# Patient Record
Sex: Male | Born: 1965 | Race: White | Hispanic: No | Marital: Married | Smoking: Never smoker
Health system: Southern US, Community
[De-identification: ages and names within clinical notes are randomized; demographics above are authoritative.]

## PROBLEM LIST (undated history)

## (undated) DIAGNOSIS — I10 Essential (primary) hypertension: Secondary | ICD-10-CM

## (undated) HISTORY — PX: OTHER SURGICAL HISTORY: SHX169

## (undated) HISTORY — PX: APPENDECTOMY: SHX54

## (undated) HISTORY — PX: COLONOSCOPY: SHX174

---

## 2006-04-08 ENCOUNTER — Ambulatory Visit: Payer: Self-pay | Admitting: Urology

## 2008-03-11 ENCOUNTER — Ambulatory Visit: Payer: Self-pay | Admitting: Urology

## 2010-04-02 IMAGING — CT CT ABD-PELV W/O CM
1 of 2 series · 16 of 32 positions shown, 20 images · non-contrast
Comparison: none

REASON FOR EXAM: Hematuria, left flank BPH
COMMENTS:

PROCEDURE:     CT  - CT ABDOMEN AND PELVIS W[DATE] [DATE]
RESULT:     The patient has a history of renal stones, left lithotripsy.
TECHNIQUE: Nonenhanced CT of abdomen and pelvis is obtained.
Comparison is made to prior KUB of 04/08/2006.

[Series 2: stone · axial · 0.86mm/px · z∈[-51,+465]mm · 16 of 188 slices shown, 20 images]
[im 8/188  soft-tissue]
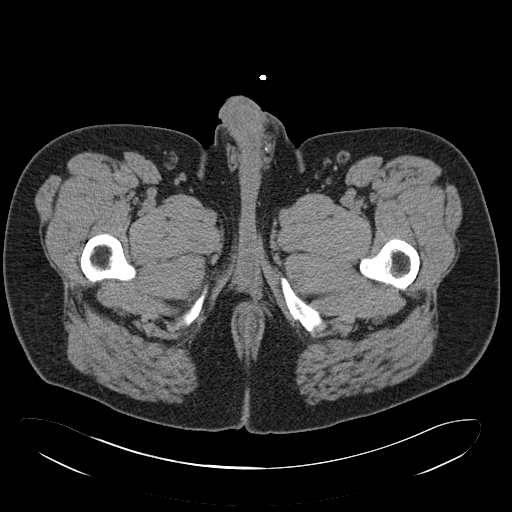
[im 8/188  bone]
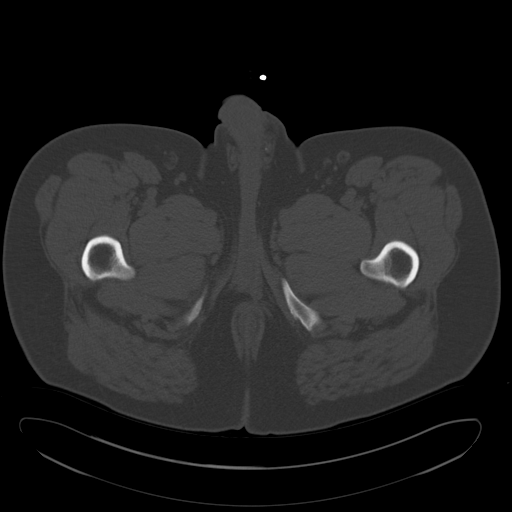
[im 22/188  soft-tissue]
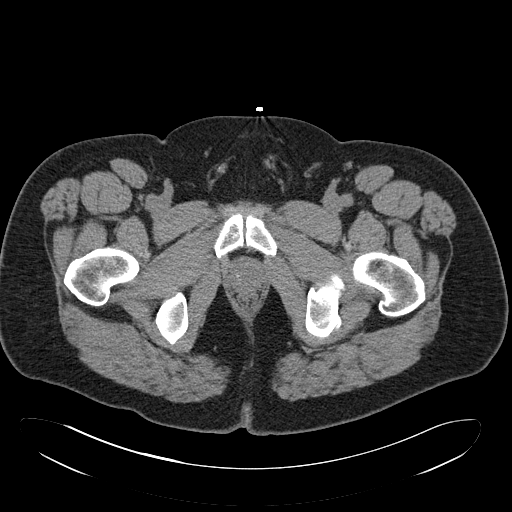
[im 36/188  soft-tissue]
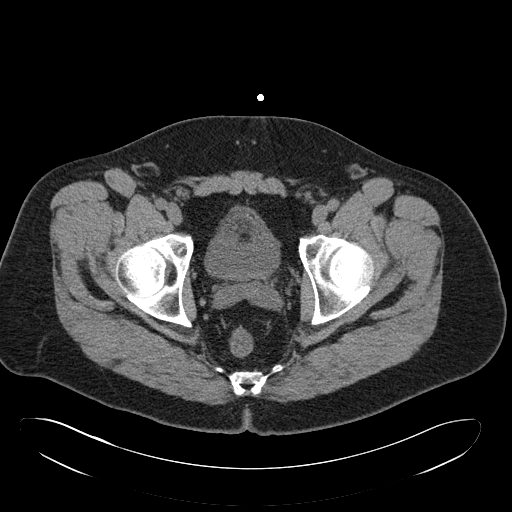
[im 51/188  soft-tissue]
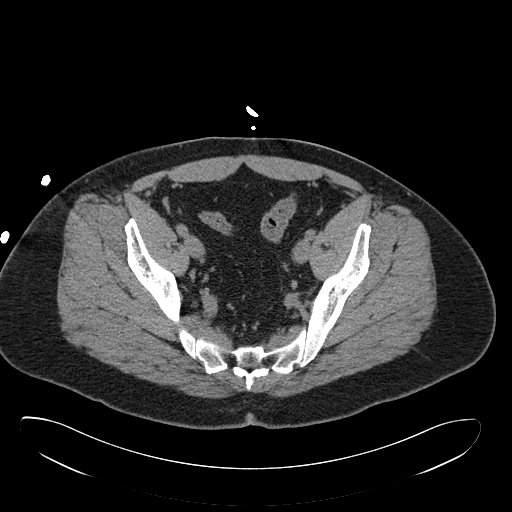
[im 65/188  soft-tissue]
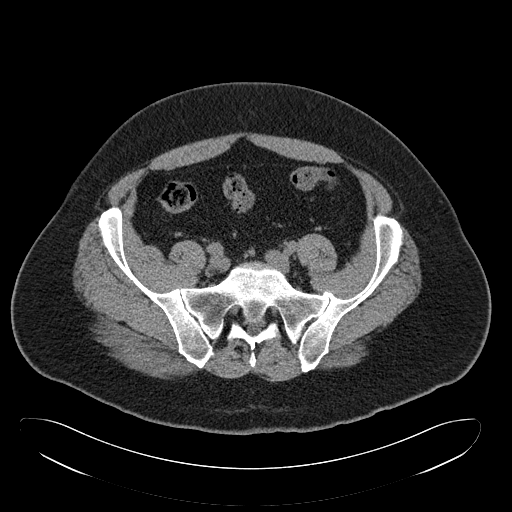
[im 72/188  soft-tissue]
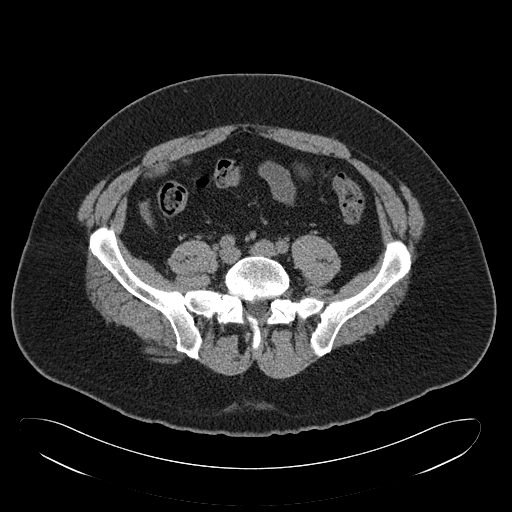
[im 87/188  soft-tissue]
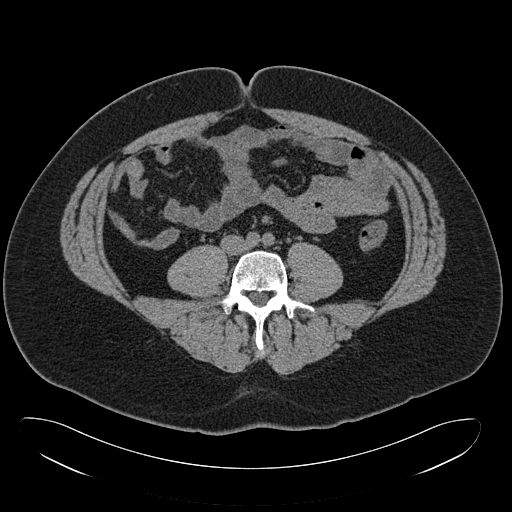
[im 101/188  soft-tissue]
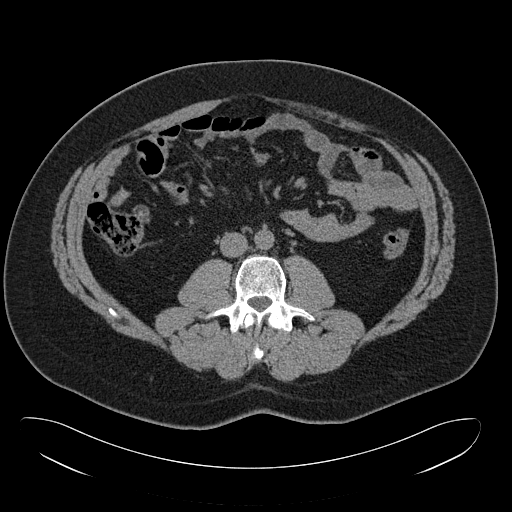
[im 116/188  soft-tissue]
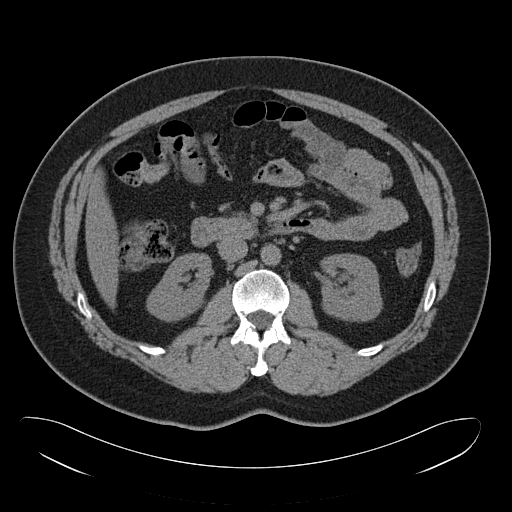
[im 116/188  bone]
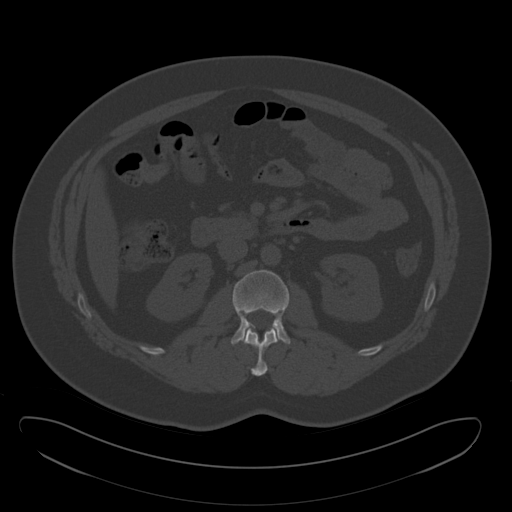
[im 123/188  soft-tissue]
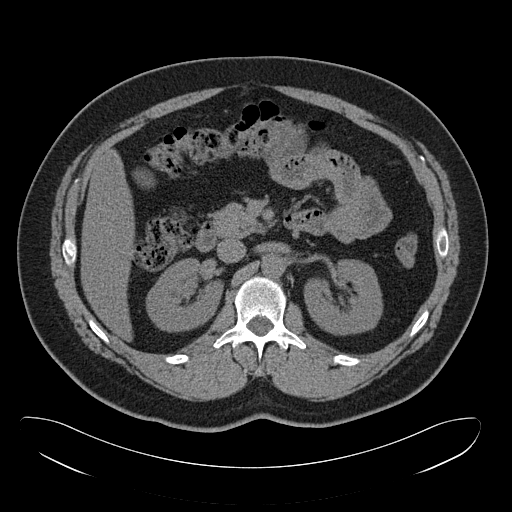
[im 137/188  soft-tissue]
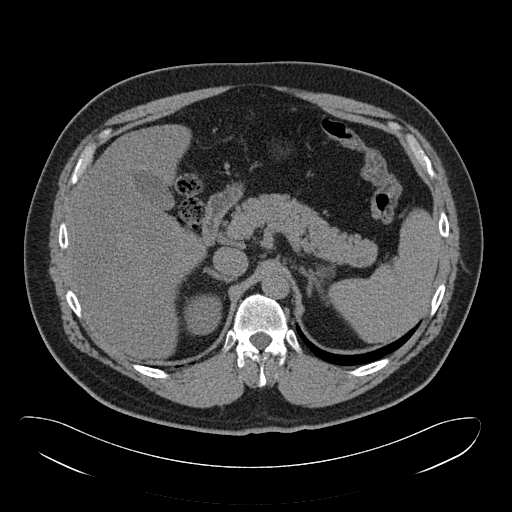
[im 152/188  soft-tissue]
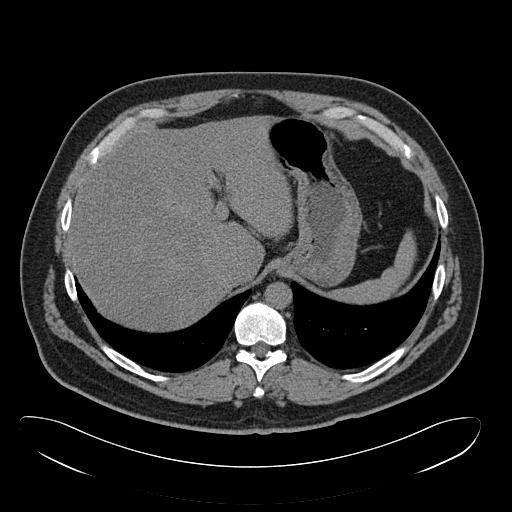
[im 159/188  lung]
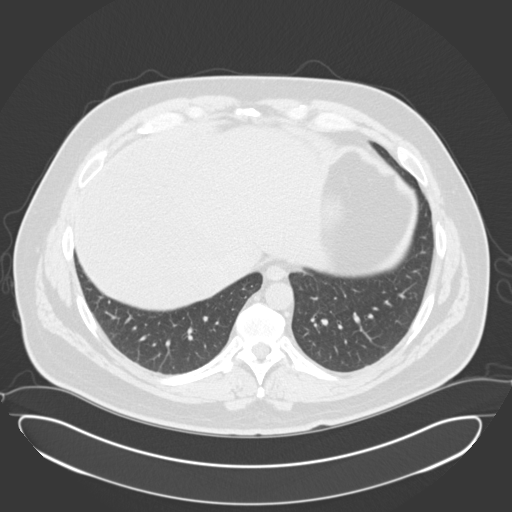
[im 166/188  soft-tissue]
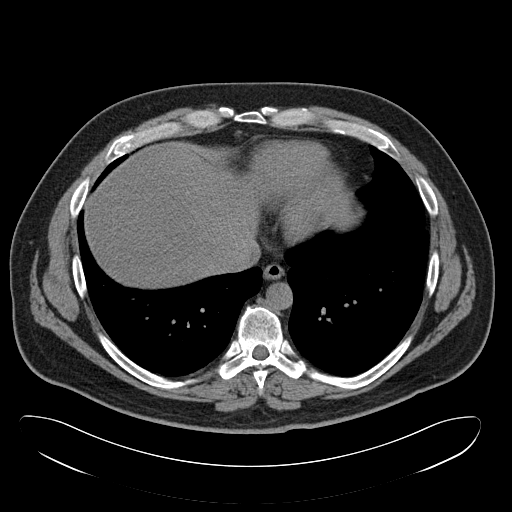
[im 166/188  lung]
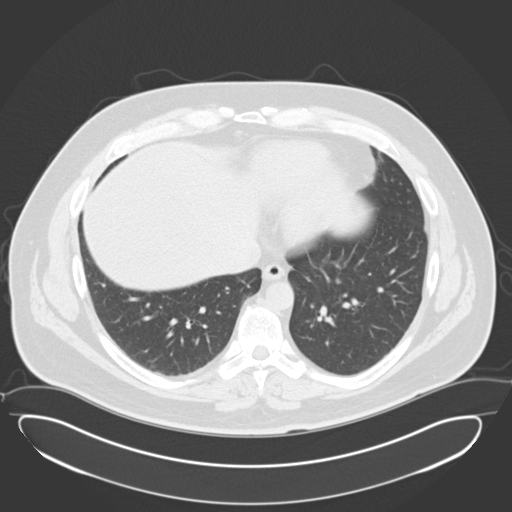
[im 173/188  lung]
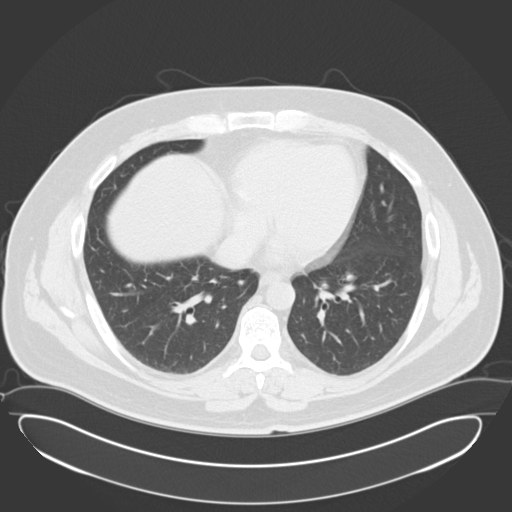
[im 180/188  soft-tissue]
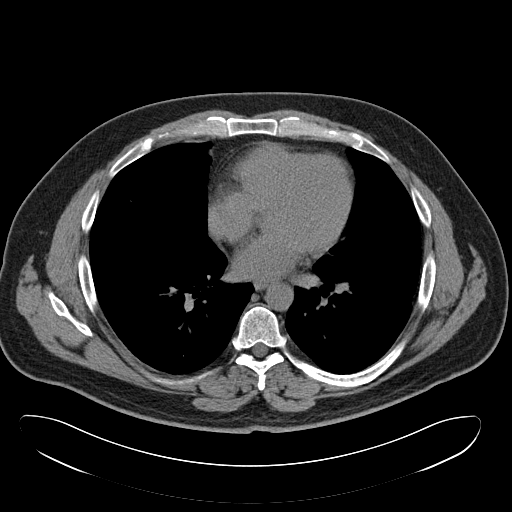
[im 180/188  lung]
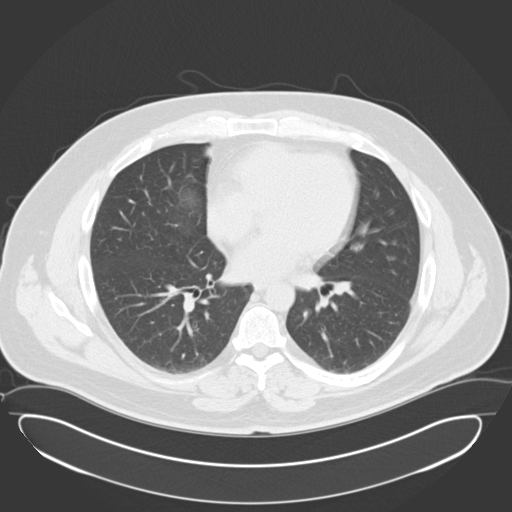

[16 of 32 positions shown; findings below may reference images not displayed]

FINDINGS: The liver and spleen are normal. The pancreas is normal. The
adrenals are normal. There is no hydronephrosis. A small, 5 mm stone is
noted in the left lower renal pole. The ureters are normal. There is no
evidence of a ureteral stone. Calcified pelvic phleboliths are present. The
bladder is unremarkable. No inguinal adenopathy is noted. Prostatic
calcification is present. A small, left adrenal lesion is noted. This is
most likely a small adrenal adenoma.
IMPRESSION: There is a 4 to 5 mm stone in the left lower renal calix.
There is no evidence of urolithiasis.

## 2019-02-24 ENCOUNTER — Other Ambulatory Visit: Payer: Self-pay

## 2019-02-24 ENCOUNTER — Ambulatory Visit: Payer: Self-pay

## 2019-02-24 DIAGNOSIS — Z021 Encounter for pre-employment examination: Secondary | ICD-10-CM

## 2019-02-24 NOTE — Progress Notes (Signed)
Presents for pre-employment drug screen. Specimen collected using [facility] Chain of Custody form for [facility] account number [id number]. Specimen # [id number]  AMD

## 2019-03-20 ENCOUNTER — Other Ambulatory Visit: Payer: Self-pay

## 2019-03-20 ENCOUNTER — Telehealth: Payer: Self-pay

## 2019-03-20 ENCOUNTER — Ambulatory Visit: Payer: Self-pay

## 2019-03-20 DIAGNOSIS — Z0110 Encounter for examination of ears and hearing without abnormal findings: Secondary | ICD-10-CM

## 2019-03-20 NOTE — Progress Notes (Signed)
Patient comes in today to complete the new hire hearing exam.

## 2019-03-20 NOTE — Telephone Encounter (Signed)
[place] scheduled for a base line hearing screen today at 8:30 am & didn't show up for appointment.  Tried to call [facility] & it went straight to voice mail. Left message for him to call the clinic to reschedule the hearing screen.  AMD

## 2019-03-26 NOTE — Addendum Note (Signed)
Addended by: Gardner Candle on: 03/26/2019 11:37 AM   Modules accepted: Orders

## 2020-07-18 ENCOUNTER — Other Ambulatory Visit: Payer: Self-pay

## 2020-07-18 DIAGNOSIS — Z0283 Encounter for blood-alcohol and blood-drug test: Secondary | ICD-10-CM

## 2020-07-18 NOTE — Progress Notes (Addendum)
Presents to [facility] for Random DOT Drug Screen & Breath Alcohol Screen.  [facility] Acct #:  [id number] [facility] Specimen #:  [id number]  AMD

## 2020-09-09 ENCOUNTER — Ambulatory Visit: Payer: Self-pay

## 2020-09-09 ENCOUNTER — Other Ambulatory Visit: Payer: Self-pay

## 2020-09-09 DIAGNOSIS — Z0110 Encounter for examination of ears and hearing without abnormal findings: Secondary | ICD-10-CM

## 2020-09-09 NOTE — Progress Notes (Signed)
Presents to [facility] for annual hearing screen.  Works for [employer] & his position requires an annual hearing screen for [facility] doctor.  AMD

## 2022-09-14 ENCOUNTER — Ambulatory Visit: Payer: Self-pay

## 2023-01-30 ENCOUNTER — Encounter: Payer: Self-pay | Admitting: *Deleted

## 2023-02-04 ENCOUNTER — Ambulatory Visit: Payer: 59

## 2023-02-04 ENCOUNTER — Encounter
Admission: RE | Disposition: A | Discharge status: Home / Self Care | Payer: Self-pay | Source: Home / Self Care | Attending: Gastroenterology

## 2023-02-04 ENCOUNTER — Encounter: Payer: Self-pay | Admitting: *Deleted

## 2023-02-04 ENCOUNTER — Ambulatory Visit
Admission: RE | Admit: 2023-02-04 | Discharge: 2023-02-04 | Disposition: A | Discharge status: Home / Self Care | Payer: 59 | Attending: Gastroenterology | Admitting: Gastroenterology

## 2023-02-04 DIAGNOSIS — E669 Obesity, unspecified: Secondary | ICD-10-CM | POA: Diagnosis not present

## 2023-02-04 DIAGNOSIS — D123 Benign neoplasm of transverse colon: Secondary | ICD-10-CM | POA: Insufficient documentation

## 2023-02-04 DIAGNOSIS — Z1211 Encounter for screening for malignant neoplasm of colon: Secondary | ICD-10-CM | POA: Diagnosis present

## 2023-02-04 DIAGNOSIS — K5730 Diverticulosis of large intestine without perforation or abscess without bleeding: Secondary | ICD-10-CM | POA: Diagnosis not present

## 2023-02-04 DIAGNOSIS — I10 Essential (primary) hypertension: Secondary | ICD-10-CM | POA: Insufficient documentation

## 2023-02-04 DIAGNOSIS — K640 First degree hemorrhoids: Secondary | ICD-10-CM | POA: Insufficient documentation

## 2023-02-04 DIAGNOSIS — Z860101 Personal history of adenomatous and serrated colon polyps: Secondary | ICD-10-CM | POA: Insufficient documentation

## 2023-02-04 DIAGNOSIS — Z6837 Body mass index (BMI) 37.0-37.9, adult: Secondary | ICD-10-CM | POA: Diagnosis not present

## 2023-02-04 DIAGNOSIS — Z09 Encounter for follow-up examination after completed treatment for conditions other than malignant neoplasm: Secondary | ICD-10-CM | POA: Insufficient documentation

## 2023-02-04 HISTORY — PX: POLYPECTOMY: SHX5525

## 2023-02-04 HISTORY — PX: COLONOSCOPY WITH PROPOFOL: SHX5780

## 2023-02-04 HISTORY — DX: Essential (primary) hypertension: I10

## 2023-02-04 SURGERY — COLONOSCOPY WITH PROPOFOL
Anesthesia: General

## 2023-02-04 MED ORDER — STERILE WATER FOR IRRIGATION IR SOLN
Status: DC | PRN
  Administered 2023-02-04: 60 mL

## 2023-02-04 MED ORDER — LIDOCAINE HCL (PF) 2 % IJ SOLN
INTRAMUSCULAR | Status: AC
  Filled 2023-02-04: qty 5

## 2023-02-04 MED ORDER — LIDOCAINE HCL (PF) 2 % IJ SOLN
INTRAMUSCULAR | Status: DC | PRN
  Administered 2023-02-04: 50 mg via INTRADERMAL

## 2023-02-04 MED ORDER — PROPOFOL 500 MG/50ML IV EMUL
INTRAVENOUS | Status: DC | PRN
  Administered 2023-02-04: 100 mg via INTRAVENOUS
  Administered 2023-02-04: 200 ug/kg/min via INTRAVENOUS

## 2023-02-04 MED ORDER — SODIUM CHLORIDE 0.9 % IV SOLN: INTRAVENOUS | Status: DC

## 2023-02-04 MED ORDER — PROPOFOL 1000 MG/100ML IV EMUL
INTRAVENOUS | Status: AC
  Filled 2023-02-04: qty 100

## 2023-02-04 NOTE — Interval H&P Note (Signed)
History and Physical Interval Note:  02/04/2023 7:46 AM  Clarence Flores  has presented today for surgery, with the diagnosis of Z86.010 (ICD-10-CM) - History of adenomatous polyp of colon.  The various methods of treatment have been discussed with the patient and family. After consideration of risks, benefits and other options for treatment, the patient has consented to  Procedure(s): COLONOSCOPY WITH PROPOFOL (N/A) as a surgical intervention.  The patient's history has been reviewed, patient examined, no change in status, stable for surgery.  I have reviewed the patient's chart and labs.  Questions were answered to the patient's satisfaction.     Regis Bill  Ok to proceed with colonoscopy

## 2023-02-04 NOTE — Op Note (Signed)
[facility] Patient Name: Clarence Flores Procedure Date: 02/04/2023 7:14 AM MRN: [id number] Account #: [id number] Date of Birth: [birth year] Admit Type: Outpatient Age: 57 Room: [facility] ENDO ROOM 3 Gender: Male Note Status: Finalized Instrument Name: Prentice Docker [id number] Procedure:             Colonoscopy Indications:           Surveillance: Personal history of adenomatous polyps                         on last colonoscopy 5 years ago Providers:             Eather Colas MD, MD Referring MD:          Eather Colas MD, MD (Referring MD), Rhona Leavens.                         Burnett Sheng, MD (Referring MD) Medicines:             Monitored Anesthesia Care Complications:         No immediate complications. Estimated blood loss:                         Minimal. Procedure:             Pre-Anesthesia Assessment:                        - Prior to the procedure, a History and Physical was                         performed, and patient medications and allergies were                         reviewed. The patient is competent. The risks and                         benefits of the procedure and the sedation options and                         risks were discussed with the patient. All questions                         were answered and informed consent was obtained.                         Patient identification and proposed procedure were                         verified by the physician, the nurse, the                         anesthesiologist, the anesthetist and the technician                         in the endoscopy suite. Mental Status Examination:                         alert and oriented. Airway Examination: normal  oropharyngeal airway and neck mobility. Respiratory                         Examination: clear to auscultation. CV Examination:                         normal. Prophylactic Antibiotics: The patient does not                          require prophylactic antibiotics. Prior                         Anticoagulants: The patient has taken no anticoagulant                         or antiplatelet agents. ASA Grade Assessment: II - A                         patient with mild systemic disease. After reviewing                         the risks and benefits, the patient was deemed in                         satisfactory condition to undergo the procedure. The                         anesthesia plan was to use monitored anesthesia care                         (MAC). Immediately prior to administration of                         medications, the patient was re-assessed for adequacy                         to receive sedatives. The heart rate, respiratory                         rate, oxygen saturations, blood pressure, adequacy of                         pulmonary ventilation, and response to care were                         monitored throughout the procedure. The physical                         status of the patient was re-assessed after the                         procedure.                        After obtaining informed consent, the colonoscope was                         passed under direct vision. Throughout the procedure,  the patient's blood pressure, pulse, and oxygen                         saturations were monitored continuously. The                         Colonoscope was introduced through the anus and                         advanced to the the cecum, identified by appendiceal                         orifice and ileocecal valve. The colonoscopy was                         performed without difficulty. The patient tolerated                         the procedure well. The quality of the bowel                         preparation was good. The ileocecal valve, appendiceal                         orifice, and rectum were photographed. Findings:      The perianal and digital rectal  examinations were normal.      A 4 mm polyp was found in the hepatic flexure. The polyp was sessile.       The polyp was removed with a cold snare. Resection and retrieval were       complete. Estimated blood loss was minimal.      A 2 mm polyp was found in the transverse colon. The polyp was sessile.       The polyp was removed with a cold snare. Resection and retrieval were       complete. Estimated blood loss was minimal.      A single small-mouthed diverticulum was found in the descending colon.      Internal hemorrhoids were found during retroflexion. The hemorrhoids       were Grade I (internal hemorrhoids that do not prolapse).      The exam was otherwise without abnormality on direct and retroflexion       views. Impression:            - One 4 mm polyp at the hepatic flexure, removed with                         a cold snare. Resected and retrieved.                        - One 2 mm polyp in the transverse colon, removed with                         a cold snare. Resected and retrieved.                        - Diverticulosis in the descending colon.                        -  Internal hemorrhoids.                        - The examination was otherwise normal on direct and                         retroflexion views. Recommendation:        - Discharge patient to home.                        - Resume previous diet.                        - Continue present medications.                        - Await pathology results.                        - Repeat colonoscopy in 7 years for surveillance.                        - Return to referring physician as previously                         scheduled. Procedure Code(s):     --- Professional ---                        [phone], Colonoscopy, flexible; with removal of                         tumor(s), polyp(s), or other lesion(s) by snare                         technique Diagnosis Code(s):     --- Professional ---                        Z86.010,  Personal history of colonic polyps                        D12.3, Benign neoplasm of transverse colon (hepatic                         flexure or splenic flexure)                        K64.0, First degree hemorrhoids                        K57.30, Diverticulosis of large intestine without                         perforation or abscess without bleeding CPT copyright 2022 American Medical Association. All rights reserved. The codes documented in this report are preliminary and upon coder review may  be revised to meet current compliance requirements. Eather Colas MD, MD 02/04/2023 8:14:16 AM Number of Addenda: 0 Note Initiated On: 02/04/2023 7:14 AM Scope Withdrawal Time: 0 hours 10 minutes 53 seconds  Total Procedure Duration: 0 hours 16 minutes 4 seconds  Estimated Blood Loss:  Estimated blood loss was minimal.      [facility]  Center

## 2023-02-04 NOTE — H&P (Signed)
Outpatient short stay form Pre-procedure 02/04/2023  Regis Bill, MD  Primary Physician: Jerl Mina, MD  Reason for visit:  Surveillance  History of present illness:    57 y/o gentleman with history of hypertension and obesity here for surveillance colonoscopy. Last colonoscopy in 2019 with small polyp. No family history of GI malignancies. No blood thinners. History of appendectomy.    Current Facility-Administered Medications:    0.9 %  sodium chloride infusion, , Intravenous, Continuous, Iantha Titsworth, Rossie Muskrat, MD, Last Rate: 20 mL/hr at 02/04/23 0716, New Bag at 02/04/23 0716  Medications Prior to Admission  Medication Sig Dispense Refill Last Dose   olmesartan (BENICAR) 40 MG tablet Take 40 mg by mouth daily.   02/03/2023     No Known Allergies   Past Medical History:  Diagnosis Date   Hypertension     Review of systems:  Otherwise negative.    Physical Exam  Gen: Alert, oriented. Appears stated age.  HEENT: PERRLA. Lungs: No respiratory distress CV: RRR Abd: soft, benign, no masses Ext: No edema    Planned procedures: Proceed with colonoscopy. The patient understands the nature of the planned procedure, indications, risks, alternatives and potential complications including but not limited to bleeding, infection, perforation, damage to internal organs and possible oversedation/side effects from anesthesia. The patient agrees and gives consent to proceed.  Please refer to procedure notes for findings, recommendations and patient disposition/instructions.     Regis Bill, MD [facility]

## 2023-02-04 NOTE — Transfer of Care (Signed)
Immediate Anesthesia Transfer of Care Note  Patient: Clarence Flores  Procedure(s) Performed: COLONOSCOPY WITH PROPOFOL POLYPECTOMY  Patient Location: PACU  Anesthesia Type:General  Level of Consciousness: drowsy  Airway & Oxygen Therapy: Patient Spontanous Breathing  Post-op Assessment: Report given to RN and Post -op Vital signs reviewed and stable  Post vital signs: Reviewed and stable  Last Vitals:  Vitals Value Taken Time  BP 90/42 02/04/23 0813  Temp    Pulse 79 02/04/23 0812  Resp 15 02/04/23 0813  SpO2 97 % 02/04/23 0812  Vitals shown include unfiled device data.  Last Pain:  Vitals:   02/04/23 0706  TempSrc: Temporal         Complications: No notable events documented.

## 2023-02-04 NOTE — Anesthesia Postprocedure Evaluation (Signed)
Anesthesia Post Note  Patient: Clarence Flores  Procedure(s) Performed: COLONOSCOPY WITH PROPOFOL POLYPECTOMY  Patient location during evaluation: Endoscopy Anesthesia Type: General Level of consciousness: awake and alert Pain management: pain level controlled Vital Signs Assessment: post-procedure vital signs reviewed and stable Respiratory status: spontaneous breathing, nonlabored ventilation, respiratory function stable and patient connected to nasal cannula oxygen Cardiovascular status: blood pressure returned to baseline and stable Postop Assessment: no apparent nausea or vomiting Anesthetic complications: no   There were no known notable events for this encounter.   Last Vitals:  Vitals:   02/04/23 0706 02/04/23 0812  BP: 129/74 (!) 98/53  Pulse: (!) 59 79  Resp: 16 17  Temp: (!) 36.3 C 36.4 C  SpO2: 99% 97%    Last Pain:  Vitals:   02/04/23 0835  TempSrc:   PainSc: 0-No pain                 Cleda Mccreedy Dean Wonder

## 2023-02-04 NOTE — Anesthesia Preprocedure Evaluation (Signed)
Anesthesia Evaluation  Patient identified by MRN, date of birth, ID band Patient awake    Reviewed: Allergy & Precautions, NPO status , Patient's Chart, lab work & pertinent test results  History of Anesthesia Complications Negative for: history of anesthetic complications  Airway Mallampati: III  TM Distance: >3 FB Neck ROM: full    Dental  (+) Chipped   Pulmonary neg pulmonary ROS, neg shortness of breath   Pulmonary exam normal        Cardiovascular Exercise Tolerance: Good hypertension, (-) angina Normal cardiovascular exam     Neuro/Psych negative neurological ROS  negative psych ROS   GI/Hepatic negative GI ROS, Neg liver ROS,neg GERD  ,,  Endo/Other  negative endocrine ROS    Renal/GU negative Renal ROS  negative genitourinary   Musculoskeletal   Abdominal   Peds  Hematology negative hematology ROS (+)   Anesthesia Other Findings Past Medical History: No date: Hypertension  Past Surgical History: No date: APPENDECTOMY No date: COLONOSCOPY No date: kidney stone removal  BMI    Body Mass Index: 37.38 kg/m      Reproductive/Obstetrics negative OB ROS                             Anesthesia Physical Anesthesia Plan  ASA: 2  Anesthesia Plan: General   Post-op Pain Management:    Induction: Intravenous  PONV Risk Score and Plan: Propofol infusion and TIVA  Airway Management Planned: Natural Airway and Nasal Cannula  Additional Equipment:   Intra-op Plan:   Post-operative Plan:   Informed Consent: I have reviewed the patients History and Physical, chart, labs and discussed the procedure including the risks, benefits and alternatives for the proposed anesthesia with the patient or authorized representative who has indicated his/her understanding and acceptance.     Dental Advisory Given  Plan Discussed with: Anesthesiologist, CRNA and Surgeon  Anesthesia Plan  Comments: (Patient consented for risks of anesthesia including but not limited to:  - adverse reactions to medications - risk of airway placement if required - damage to eyes, teeth, lips or other oral mucosa - nerve damage due to positioning  - sore throat or hoarseness - Damage to heart, brain, nerves, lungs, other parts of body or loss of life  Patient voiced understanding and assent.)       Anesthesia Quick Evaluation

## 2023-02-05 ENCOUNTER — Encounter: Payer: Self-pay | Admitting: Gastroenterology

## 2023-02-05 LAB — SURGICAL PATHOLOGY

## 2023-08-21 ENCOUNTER — Encounter: Payer: Self-pay | Admitting: Podiatry

## 2023-08-21 ENCOUNTER — Ambulatory Visit (INDEPENDENT_AMBULATORY_CARE_PROVIDER_SITE_OTHER): Admitting: Podiatry

## 2023-08-21 ENCOUNTER — Ambulatory Visit (INDEPENDENT_AMBULATORY_CARE_PROVIDER_SITE_OTHER)

## 2023-08-21 VITALS — Ht 71.0 in | Wt 268.0 lb

## 2023-08-21 DIAGNOSIS — M21611 Bunion of right foot: Secondary | ICD-10-CM | POA: Diagnosis not present

## 2023-08-21 DIAGNOSIS — M21619 Bunion of unspecified foot: Secondary | ICD-10-CM

## 2023-08-21 DIAGNOSIS — M205X1 Other deformities of toe(s) (acquired), right foot: Secondary | ICD-10-CM | POA: Diagnosis not present

## 2023-08-21 NOTE — Progress Notes (Signed)
 Subjective:  Patient ID: Clarence Flores, male    DOB: [birth year],  MRN: [id number]  Chief Complaint  Patient presents with   Bunions    Pt is here due to bunion on right foot states it starting to bother him specially when he wears certain shoes.    Discussed the use of AI scribe software for clinical note transcription with the patient, who gave verbal consent to proceed.  History of Present Illness BRALYNN Flores is a 58 year old male who presents with a bunion on his right foot causing discomfort and limited shoe options.  He has a bunion on his right foot that has been present for a while and appears to be enlarging, making it increasingly difficult to find shoes that do not cause pain. No history of gout, redness, heat, or swelling in the area.  He describes a 'hard nodule' or bone spur associated with the bunion, which can be moved around. Discomfort occurs when wearing certain shoes, particularly after prolonged periods, due to pressure on the bunion and surrounding nerve.  He mentions a second area of concern under his big toe, which he can manipulate without significant pain, though there is some discomfort in a different area. This issue arose after the bunion began to bother him.  He is currently taking medication for high blood pressure, specifically 40 mg of an unspecified medication.  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      Objective:    Physical Exam VASCULAR: DP and PT pulse palpable. Foot is warm and well-perfused. Capillary fill time is brisk. DERMATOLOGIC: Benign hemangioma on right foot. Normal skin turgor, texture, and temperature. No open lesions, rashes, or ulcerations. NEUROLOGIC: Normal sensation to light touch and pressure. No paresthesias on examination. ORTHOPEDIC: Right foot dorsal and medial bunion with limited range of motion of first MTP joint. Mild  tenderness under plantar MTP joint. No ecchymosis or bruising. No gross deformity.   No images are attached to the encounter.    Results RADIOLOGY Right foot X-ray: Hallux limitus with relatively well-maintained joint space, dorsal loose body, and osteophyte. Second metatarsal not elongated. (08/21/2023)   Assessment:   1. Hallux limitus of right foot      Plan:  Patient was evaluated and treated and all questions answered.  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 A bone spur, identified as a loose body, contributes to the condition, with nerve pain due to joint pinching. X-rays show hallux limitus with relatively well-maintained joint space, a dorsal loose body, and osteophyte. Surgical options include bone spur removal or great toe fusion if cartilage is significantly worn. Risks include infection, nerve irritation (common post-surgery, resolving in 1-3 months), and delayed bone healing (approximately 10% chance, with severe cases requiring revision surgery being less than 2%). - Complete paperwork for surgery scheduling. - Schedule surgery for bone spur removal with potential for great toe fusion based on intraoperative findings.  I discussed with him that radiographically he has a relatively well-maintained joint space and if his cartilage is intact and we will perform an open cheilectomy but if the joint is arthritic and we will proceed with fusion - Discuss risks of surgery including infection, nerve irritation, and delayed bone healing. - Plan for worst-case recovery scenario with hope for  best-case outcome.  Capsulitis of the toe Capsulitis in the second toe, likely due to strain from the bunion and altered weight distribution. No significant elongation of the second metatarsal on X-ray. Conservative management preferred unless fusion surgery  is performed, in which case shortening of the metatarsal may be considered. An insole may help redistribute pressure away from the joint. - Consider steroid injection for capsulitis if needed. - Discuss potential for surgical shortening of the second metatarsal if fusion is performed. - Consider custom insole to redistribute pressure.  Hypertension High blood pressure.  Follow-up Plans for surgery scheduling and coordination with work for [employer]. - Complete surgery scheduling paperwork and coordinate with surgery scheduler. - Arrange FMLA paperwork with work and coordinate with Raquel Browning for disability paperwork.    Surgical plan:  Procedure: - Open cheilectomy versus first MTP fusion right foot  Location: - [facility]  Anesthesia plan: - Sedation with regional block  Postoperative pain plan: - Tylenol 1000 mg every 6 hours, ibuprofen 600 mg every 6 hours, gabapentin 300 mg every 8 hours x5 days, oxycodone 5 mg 1-2 tabs every 6 hours only as needed  DVT prophylaxis: - None required  WB Restrictions / DME needs: - WBAT in surgical shoe versus NWB in CAM boot postop    No follow-ups on file.

## 2023-08-28 ENCOUNTER — Telehealth: Payer: Self-pay | Admitting: Podiatry

## 2023-08-28 NOTE — Telephone Encounter (Signed)
 Called pt to confirm surgery on 10/04/23 and pt is not on any blood thinners or GLP1 medications. Requested he look thru the blue bag for pre op information.  Pharmacy is correct in chart.

## 2023-09-17 DIAGNOSIS — Z0279 Encounter for issue of other medical certificate: Secondary | ICD-10-CM

## 2023-09-18 ENCOUNTER — Telehealth: Payer: Self-pay | Admitting: Podiatry

## 2023-09-18 NOTE — Telephone Encounter (Signed)
 Recd [facility] forms for pt. DOS 10/04/23 Leave 10/04/23-10/25/23 approx. Faxing bck to the [employer] office for the pt to pick up.

## 2023-09-22 ENCOUNTER — Encounter: Payer: Self-pay | Admitting: Podiatry

## 2023-09-24 ENCOUNTER — Other Ambulatory Visit: Payer: Self-pay

## 2023-09-24 NOTE — Progress Notes (Signed)
 UDS PENDING WITH [facility] [employer]: SPECIMEN ID #: [id number]  ETOH: CLEARED.

## 2023-09-25 ENCOUNTER — Telehealth: Payer: Self-pay | Admitting: Podiatry

## 2023-09-25 NOTE — Telephone Encounter (Signed)
 DOS- 10/04/2023  CHEILECTOMY RT- 71710 HALLUX MPJ FUSION RT- 71249  [employer] EFFECTIVE DATE- 07/18/2022  DEDUCTIBLE- $500 FAMILY- $1000 REMAINING- $500 FAMILY REMAINING- $1000 OOP- $2000 FAMILY- $4000 REMAINING- $2000 FAMILY REMAINING- $3975.15 COINSURANCE- 10%  PER [employer] AUTOMATED SYSTEM, NO PRIOR AUTHS ARE REQUIRED FOR CPT CODES 71710 AND [id number]. REF# [id number]

## 2023-10-04 ENCOUNTER — Other Ambulatory Visit: Payer: Self-pay | Admitting: Podiatry

## 2023-10-04 DIAGNOSIS — M205X1 Other deformities of toe(s) (acquired), right foot: Secondary | ICD-10-CM | POA: Diagnosis not present

## 2023-10-04 MED ORDER — IBUPROFEN 600 MG PO TABS: 600.0000 mg | ORAL_TABLET | Freq: Four times a day (QID) | ORAL | 0 refills | Status: AC | PRN

## 2023-10-04 MED ORDER — ACETAMINOPHEN 500 MG PO TABS: 1000.0000 mg | ORAL_TABLET | Freq: Four times a day (QID) | ORAL | 0 refills | Status: AC | PRN

## 2023-10-04 MED ORDER — TRAMADOL HCL 50 MG PO TABS: 50.0000 mg | ORAL_TABLET | Freq: Two times a day (BID) | ORAL | 0 refills | Status: AC | PRN

## 2023-10-09 ENCOUNTER — Ambulatory Visit (INDEPENDENT_AMBULATORY_CARE_PROVIDER_SITE_OTHER): Admitting: Podiatry

## 2023-10-09 ENCOUNTER — Ambulatory Visit (INDEPENDENT_AMBULATORY_CARE_PROVIDER_SITE_OTHER)

## 2023-10-09 ENCOUNTER — Encounter: Payer: Self-pay | Admitting: Podiatry

## 2023-10-09 VITALS — Ht 71.0 in | Wt 268.0 lb

## 2023-10-09 DIAGNOSIS — M205X1 Other deformities of toe(s) (acquired), right foot: Secondary | ICD-10-CM

## 2023-10-09 NOTE — Progress Notes (Signed)
  Subjective:  Patient ID: Clarence Flores, male    DOB: May 24, 1965,  MRN: [id number]  Chief Complaint  Patient presents with   Post-op Follow-up    Rm 1 POV # 1 DOS 10/04/23 RT BONE SPUR REMOVAL VS GREAT TOE FUSION. Patient states minor pain and discomfort at the surgical site. Surgical site is healing well, some bruising along the toes, sutures are intact with minimum swelling and redness.     58 y.o. male returns for post-op check.   Review of Systems: Negative except as noted in the HPI. Denies N/V/F/Ch.   Objective:  There were no vitals filed for this visit. Body mass index is 37.38 kg/m. Constitutional Well developed. Well nourished.  Vascular Foot warm and well perfused. Capillary refill normal to all digits.  Calf is soft and supple, no posterior calf or knee pain, negative Homans' sign  Neurologic Normal speech. Oriented to person, place, and time. Epicritic sensation to light touch grossly present bilaterally.  Dermatologic Skin healing well without signs of infection. Skin edges well coapted without signs of infection.  Orthopedic: Tenderness to palpation noted about the surgical site.   Multiple view plain film radiographs: Interval reduction of bony spurring and loose body removal Assessment:   1. Hallux limitus of right foot    Plan:  Patient was evaluated and treated and all questions answered.  S/p foot surgery right -Progressing as expected post-operatively.  Okay utilize regular shoes as needed if comfortable.  Begin range of motion of MTP joint.  Return in 2 weeks to have sutures removed.   No follow-ups on file.

## 2023-10-23 ENCOUNTER — Ambulatory Visit (INDEPENDENT_AMBULATORY_CARE_PROVIDER_SITE_OTHER): Admitting: Podiatry

## 2023-10-23 ENCOUNTER — Encounter: Payer: Self-pay | Admitting: Podiatry

## 2023-10-23 VITALS — Ht 71.0 in | Wt 268.0 lb

## 2023-10-23 DIAGNOSIS — M205X1 Other deformities of toe(s) (acquired), right foot: Secondary | ICD-10-CM

## 2023-10-23 NOTE — Progress Notes (Signed)
  Subjective:  Patient ID: Clarence Flores, male    DOB: 11/28/1965,  MRN: [id number]  Chief Complaint  Patient presents with   Post-op Follow-up    Rm 1 POV # 2 DOS 10/04/23 RT BONE SPUR REMOVAL VS GREAT TOE FUSION. Surgical site is healing well, sutures are intact with some redness and no swelling. Patient states minimum pain and discomfort.     58 y.o. male returns for post-op check.  Had some random sharp shooting pains last week otherwise doing well working on range of motion  Review of Systems: Negative except as noted in the HPI. Denies N/V/F/Ch.   Objective:  There were no vitals filed for this visit. Body mass index is 37.38 kg/m. Constitutional Well developed. Well nourished.  Vascular Foot warm and well perfused. Capillary refill normal to all digits.  Calf is soft and supple, no posterior calf or knee pain, negative Homans' sign  Neurologic Normal speech. Oriented to person, place, and time. Epicritic sensation to light touch grossly present bilaterally.  Dermatologic Incision is well-healed  Orthopedic: Minimal pain or edema good range of motion   Multiple view plain film radiographs: Interval reduction of bony spurring and loose body removal Assessment:   1. Hallux limitus of right foot     Plan:  Patient was evaluated and treated and all questions answered.  S/p foot surgery right - Doing well may return to regular shoe gear as tolerated and full duty without restrictions at work next week.  Return to see me in 3 weeks for new radiographs.  Continue range of motion of the toe.  Discussed the nerve type pain and swelling will continue for another few weeks   No follow-ups on file.

## 2023-11-13 ENCOUNTER — Encounter: Admitting: Podiatry

## 2023-11-18 ENCOUNTER — Ambulatory Visit (INDEPENDENT_AMBULATORY_CARE_PROVIDER_SITE_OTHER)

## 2023-11-18 ENCOUNTER — Ambulatory Visit (INDEPENDENT_AMBULATORY_CARE_PROVIDER_SITE_OTHER): Admitting: Podiatry

## 2023-11-18 VITALS — Ht 71.0 in | Wt 268.0 lb

## 2023-11-18 DIAGNOSIS — M205X1 Other deformities of toe(s) (acquired), right foot: Secondary | ICD-10-CM

## 2023-11-21 NOTE — Progress Notes (Signed)
  Subjective:  Patient ID: Clarence Flores, male    DOB: June 04, 1965,  MRN: [id number]  Chief Complaint  Patient presents with   Post-op Follow-up    Rm 6 POV # 3 DOS 10/04/23 RT BONE SPUR REMOVAL VS GREAT TOE FUSION     58 y.o. male returns for post-op check.  He is doing well  Review of Systems: Negative except as noted in the HPI. Denies N/V/F/Ch.   Objective:  There were no vitals filed for this visit. Body mass index is 37.38 kg/m. Constitutional Well developed. Well nourished.  Vascular Foot warm and well perfused. Capillary refill normal to all digits.  Calf is soft and supple, no posterior calf or knee pain, negative Homans' sign  Neurologic Normal speech. Oriented to person, place, and time. Epicritic sensation to light touch grossly present bilaterally.  Dermatologic Incision is well-healed  Orthopedic: Minimal pain or edema good range of motion   Multiple view plain film radiographs: Interval reduction of bony spurring and loose body removal Assessment:   1. Hallux limitus of right foot     Plan:  Patient was evaluated and treated and all questions answered.  S/p foot surgery right - Overall doing well not have any issues.  Regular shoe gear and activity as tolerated.  No restrictions at this point.  Follow-up with me as needed if worsens.   No follow-ups on file.

## 2023-12-12 ENCOUNTER — Other Ambulatory Visit: Payer: Self-pay

## 2023-12-12 NOTE — Progress Notes (Signed)
 Completed random [employer] UDS after consents signed per [employer] protocol.
